# Patient Record
Sex: Male | Born: 1960 | Race: White | Hispanic: No | Marital: Married | Smoking: Never smoker
Health system: Southern US, Community
[De-identification: ages and names within clinical notes are randomized; demographics above are authoritative.]

---

## 2005-08-22 ENCOUNTER — Emergency Department (HOSPITAL_COMMUNITY): Admission: EM | Admit: 2005-08-22 | Discharge: 2005-08-22 | Payer: Self-pay | Admitting: Family Medicine

## 2008-01-24 ENCOUNTER — Ambulatory Visit: Payer: Self-pay | Admitting: Sports Medicine

## 2008-01-24 DIAGNOSIS — M79609 Pain in unspecified limb: Secondary | ICD-10-CM | POA: Insufficient documentation

## 2008-01-24 DIAGNOSIS — M2020 Hallux rigidus, unspecified foot: Secondary | ICD-10-CM | POA: Insufficient documentation

## 2008-01-24 DIAGNOSIS — R252 Cramp and spasm: Secondary | ICD-10-CM | POA: Insufficient documentation

## 2014-03-18 ENCOUNTER — Other Ambulatory Visit: Payer: Self-pay | Admitting: Family Medicine

## 2014-03-18 DIAGNOSIS — N390 Urinary tract infection, site not specified: Secondary | ICD-10-CM

## 2014-04-03 ENCOUNTER — Ambulatory Visit
Admission: RE | Admit: 2014-04-03 | Discharge: 2014-04-03 | Disposition: A | Discharge status: Home / Self Care | Payer: 59 | Source: Ambulatory Visit | Attending: Family Medicine | Admitting: Family Medicine

## 2014-04-03 DIAGNOSIS — N390 Urinary tract infection, site not specified: Secondary | ICD-10-CM

## 2016-02-06 ENCOUNTER — Encounter (HOSPITAL_COMMUNITY): Payer: Self-pay | Admitting: *Deleted

## 2016-02-06 ENCOUNTER — Ambulatory Visit (HOSPITAL_COMMUNITY)
Admission: EM | Admit: 2016-02-06 | Discharge: 2016-02-06 | Disposition: A | Discharge status: Home / Self Care | Payer: Managed Care, Other (non HMO) | Attending: Emergency Medicine | Admitting: Emergency Medicine

## 2016-02-06 DIAGNOSIS — M5442 Lumbago with sciatica, left side: Secondary | ICD-10-CM

## 2016-02-06 MED ORDER — CYCLOBENZAPRINE HCL 10 MG PO TABS: 10.0000 mg | ORAL_TABLET | Freq: Three times a day (TID) | ORAL | 0 refills | Status: AC | PRN

## 2016-02-06 MED ORDER — HYDROCODONE-ACETAMINOPHEN 5-325 MG PO TABS: 1.0000 | ORAL_TABLET | Freq: Four times a day (QID) | ORAL | 0 refills | Status: AC | PRN

## 2016-02-06 MED ORDER — PREDNISONE 50 MG PO TABS: ORAL_TABLET | ORAL | 0 refills | Status: AC

## 2016-02-06 NOTE — ED Provider Notes (Signed)
[facility]    CSN: [id number] Arrival date & time: 02/06/16  1716  First Provider Contact:  First MD Initiated Contact with Patient 02/06/16 1804        History   Chief Complaint Chief Complaint  Patient presents with  . Back Pain  . Leg Pain    HPI Christopher Flores is a 55 y.o. male.   He is a 55 year old man here for evaluation of back pain. He states this started 2 weeks ago. He denies any specific injury or trauma, but states when he stood up, he developed a sharp pain across his lower back. He has had similar pain in the past, but not so severe. Over the next week it gradually improved. Then, last week, it seemed to worsen a little bit and he developed radiating pain, primarily down the left leg. Pain tends to be worse on first standing and going from sitting to standing. Yesterday, he developed much more severe pain part way through a bike ride. He states when they stopped at the 47 mile mark, he got off the bike he developed severe pain in his back and shooting down his left leg. He has been taking Aleve with some improvement. He had some medication with codeine from [place] that he has been taking for the last 24 hours with temporary improvement of the pain. No bowel or bladder incontinence. No weakness.      No past medical history on file.  Patient Active Problem List   Diagnosis Date Noted  . CALF PAIN, BILATERAL 01/24/2008  . CRAMP OF LIMB 01/24/2008  . 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     Home Medications    Prior to Admission medications   Medication Sig Start Date End Date Taking? Authorizing Provider  naproxen sodium (ANAPROX) 220 MG tablet Take 220 mg by mouth 2 (two) times daily with a meal.   Yes Historical Provider, MD  cyclobenzaprine (FLEXERIL) 10 MG tablet Take 1 tablet (10 mg total) by mouth 3 (three) times daily as needed for muscle spasms. 02/06/16   Charm Rings, MD  HYDROcodone-acetaminophen  (NORCO) 5-325 MG tablet Take 1 tablet by mouth every 6 (six) hours as needed for moderate pain. 02/06/16   Charm Rings, MD  predniSONE (DELTASONE) 50 MG tablet Take 1 pill daily for 5 days. 02/06/16   Charm Rings, MD    Family History No family history on file.  Social History Social History  Substance Use Topics  . Smoking status: Never Smoker  . Smokeless tobacco: Never Used  . Alcohol use Yes     Comment: occ     Allergies   Review of patient's allergies indicates no known allergies.   Review of Systems Review of Systems  Musculoskeletal: Positive for back pain.       Leg pain     Physical Exam Triage Vital Signs ED Triage Vitals  Enc Vitals Group     BP 02/06/16 1806 155/100     Pulse Rate 02/06/16 1806 78     Resp 02/06/16 1806 19     Temp 02/06/16 1806 98 F (36.7 C)     Temp Source 02/06/16 1806 Oral     SpO2 02/06/16 1806 99 %     Weight --      Height --      Head Circumference --      Peak Flow --      Pain Score  02/06/16 1807 9     Pain Loc --      Pain Edu? --      Excl. in GC? --    No data found.   Updated Vital Signs BP 155/100   Pulse 78   Temp 98 F (36.7 C) (Oral)   Resp 19   SpO2 99%   Visual Acuity Right Eye Distance:   Left Eye Distance:   Bilateral Distance:    Right Eye Near:   Left Eye Near:    Bilateral Near:     Physical Exam  Constitutional: He is oriented to person, place, and time. He appears well-developed and well-nourished. No distress.  Cardiovascular: Normal rate.   Pulmonary/Chest: Effort normal.  Musculoskeletal:  Back: No erythema or edema. Icy hot patch in place across the lower back. No vertebral tenderness or step-offs. He does have significant spasm in the left lumbar paraspinous musculature. This is mildly tender to palpation. Positive straight leg raise on the left.  Neurological: He is alert and oriented to person, place, and time.     UC Treatments / Results  Labs (all labs ordered are listed,  but only abnormal results are displayed) Labs Reviewed - No data to display  EKG  EKG Interpretation None       Radiology No results found.  Procedures Procedures (including critical care time)  Medications Ordered in UC Medications - No data to display   Initial Impression / Assessment and Plan / UC Course  I have reviewed the triage vital signs and the nursing notes.  Pertinent labs & imaging results that were available during my care of the patient were reviewed by me and considered in my medical decision making (see chart for details).  Clinical Course    Extensive discussion with patient regarding possible etiologies of sciatica. At this time, I suspect this is coming from muscle spasm rather than a herniated or bulging disc. Symptomatic treatment with prednisone, Aleve as needed, and hydrocodone as needed. Recommended relative rest with gentle stretching over the next week. Follow-up with PCP in 2-3 weeks. If pain is persistent, he will likely need an MRI.  Final Clinical Impressions(s) / UC Diagnoses   Final diagnoses:  Bilateral low back pain with left-sided sciatica    New Prescriptions Discharge Medication List as of 02/06/2016  6:42 PM    START taking these medications   Details  cyclobenzaprine (FLEXERIL) 10 MG tablet Take 1 tablet (10 mg total) by mouth 3 (three) times daily as needed for muscle spasms., Starting Sun 02/06/2016, Print    HYDROcodone-acetaminophen (NORCO) 5-325 MG tablet Take 1 tablet by mouth every 6 (six) hours as needed for moderate pain., Starting Sun 02/06/2016, Print    predniSONE (DELTASONE) 50 MG tablet Take 1 pill daily for 5 days., Print         Charm Rings, MD 02/06/16 [phone]

## 2016-02-06 NOTE — ED Triage Notes (Signed)
Patient reports lower back pain since 7/14. Pain started radiating down into left and right leg 7/25, left more then right. Was seen by chiropractor on 7/28. States that yesterday he went for a bike ride and had to stop due to severe pain. Patient reports he has taken scheduled 2 meds from [place] to help with the pain and aleve. Patient reports intermittent numbness and tingling to both feet, non at this time. Denies any injury associated with back pain.

## 2016-02-06 NOTE — Discharge Instructions (Signed)
I suspect your sciatic nerve is getting irritated by the muscles rather than a herniated disc. Take prednisone daily for 5 days. You can continue the Aleve as needed. Take flexeril 3 times a day as needed for muscle spasm and tightness. Take hydrocodone every 4-6 hours as needed for severe pain. Take it easy and do gentle stretching over the next week. Follow-up with your primary care doctor in 2-3 weeks for a recheck. If this does not improve, you will likely need an MRI.

## 2016-05-22 ENCOUNTER — Other Ambulatory Visit: Payer: Self-pay | Admitting: Family Medicine

## 2016-05-22 DIAGNOSIS — M545 Low back pain: Secondary | ICD-10-CM

## 2016-05-28 ENCOUNTER — Ambulatory Visit
Admission: RE | Admit: 2016-05-28 | Discharge: 2016-05-28 | Disposition: A | Discharge status: Home / Self Care | Payer: 59 | Source: Ambulatory Visit | Attending: Family Medicine | Admitting: Family Medicine

## 2016-05-28 DIAGNOSIS — M545 Low back pain: Secondary | ICD-10-CM

## 2016-11-28 DIAGNOSIS — M5440 Lumbago with sciatica, unspecified side: Secondary | ICD-10-CM | POA: Diagnosis not present

## 2016-11-28 DIAGNOSIS — M5136 Other intervertebral disc degeneration, lumbar region: Secondary | ICD-10-CM | POA: Diagnosis not present

## 2016-11-28 DIAGNOSIS — M48062 Spinal stenosis, lumbar region with neurogenic claudication: Secondary | ICD-10-CM | POA: Diagnosis not present

## 2016-11-28 DIAGNOSIS — M5126 Other intervertebral disc displacement, lumbar region: Secondary | ICD-10-CM | POA: Diagnosis not present

## 2017-03-30 DIAGNOSIS — R8290 Unspecified abnormal findings in urine: Secondary | ICD-10-CM | POA: Diagnosis not present

## 2017-03-30 DIAGNOSIS — R300 Dysuria: Secondary | ICD-10-CM | POA: Diagnosis not present

## 2017-03-30 DIAGNOSIS — Z23 Encounter for immunization: Secondary | ICD-10-CM | POA: Diagnosis not present

## 2017-03-30 DIAGNOSIS — R361 Hematospermia: Secondary | ICD-10-CM | POA: Diagnosis not present

## 2017-04-24 DIAGNOSIS — R361 Hematospermia: Secondary | ICD-10-CM | POA: Diagnosis not present

## 2017-04-24 DIAGNOSIS — N400 Enlarged prostate without lower urinary tract symptoms: Secondary | ICD-10-CM | POA: Diagnosis not present

## 2017-05-30 DIAGNOSIS — J029 Acute pharyngitis, unspecified: Secondary | ICD-10-CM | POA: Diagnosis not present

## 2017-05-30 DIAGNOSIS — J069 Acute upper respiratory infection, unspecified: Secondary | ICD-10-CM | POA: Diagnosis not present

## 2017-06-04 DIAGNOSIS — M48062 Spinal stenosis, lumbar region with neurogenic claudication: Secondary | ICD-10-CM | POA: Diagnosis not present

## 2017-06-04 DIAGNOSIS — M5440 Lumbago with sciatica, unspecified side: Secondary | ICD-10-CM | POA: Diagnosis not present

## 2017-06-04 DIAGNOSIS — M5136 Other intervertebral disc degeneration, lumbar region: Secondary | ICD-10-CM | POA: Diagnosis not present

## 2017-06-04 DIAGNOSIS — M5126 Other intervertebral disc displacement, lumbar region: Secondary | ICD-10-CM | POA: Diagnosis not present

## 2017-07-17 DIAGNOSIS — R361 Hematospermia: Secondary | ICD-10-CM | POA: Diagnosis not present

## 2017-07-17 DIAGNOSIS — N400 Enlarged prostate without lower urinary tract symptoms: Secondary | ICD-10-CM | POA: Diagnosis not present

## 2018-01-11 DIAGNOSIS — M5432 Sciatica, left side: Secondary | ICD-10-CM | POA: Diagnosis not present

## 2018-01-11 DIAGNOSIS — M545 Low back pain: Secondary | ICD-10-CM | POA: Diagnosis not present

## 2018-01-31 DIAGNOSIS — M5126 Other intervertebral disc displacement, lumbar region: Secondary | ICD-10-CM | POA: Diagnosis not present

## 2018-01-31 DIAGNOSIS — M5440 Lumbago with sciatica, unspecified side: Secondary | ICD-10-CM | POA: Diagnosis not present

## 2018-01-31 DIAGNOSIS — M5416 Radiculopathy, lumbar region: Secondary | ICD-10-CM | POA: Diagnosis not present

## 2018-01-31 DIAGNOSIS — M4726 Other spondylosis with radiculopathy, lumbar region: Secondary | ICD-10-CM | POA: Diagnosis not present

## 2018-02-25 IMAGING — MR MR LUMBAR SPINE W/O CM
5 series · 47 of 48 positions shown · non-contrast
Comparison: None.

CLINICAL DATA: Low back and left buttock and leg pain to the mid
calf since January 2016. No known injury.

EXAM:
MRI LUMBAR SPINE WITHOUT CONTRAST
TECHNIQUE: Multiplanar, multisequence MR imaging of the lumbar spine was
performed. No intravenous contrast was administered.

[Series 3: T2 · sagittal · 4.0mm · 0.88mm/px · 6 of 13 slices shown (1 of 2)]
[im 1/13]
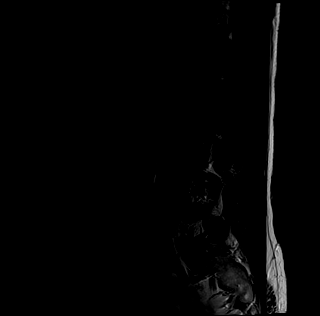
[im 3/13]
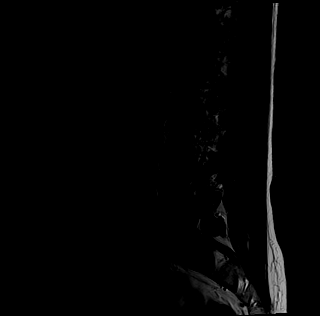
[im 5/13]
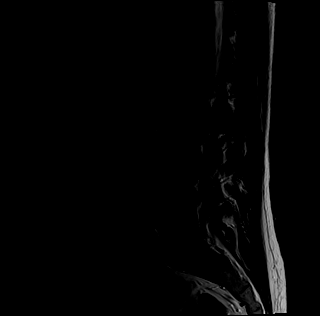
[im 8/13]
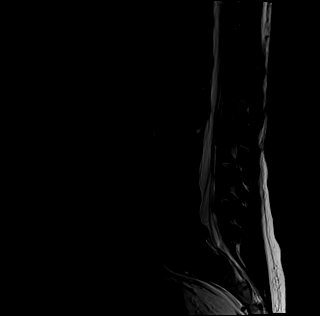
[im 10/13]
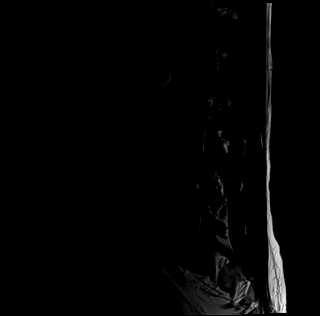
[im 13/13]
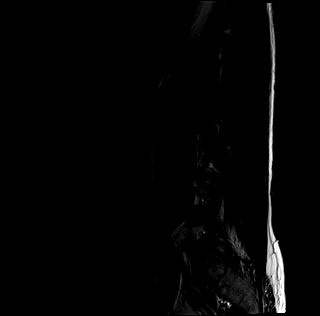

[Series 4: T1 · sagittal · 4.0mm · 0.88mm/px · 5 of 13 slices shown (1 of 2)]
[im 1/13]
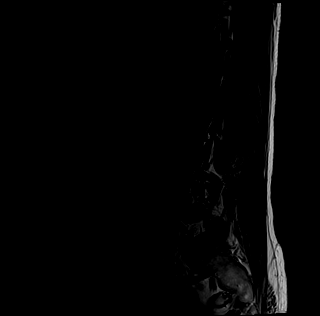
[im 4/13]
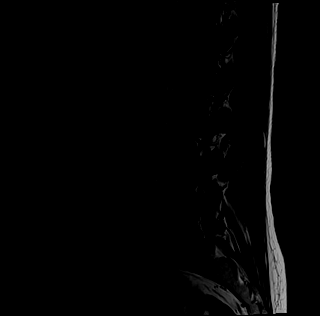
[im 7/13]
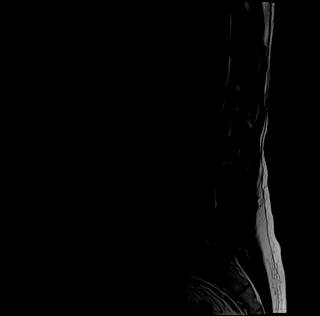
[im 10/13]
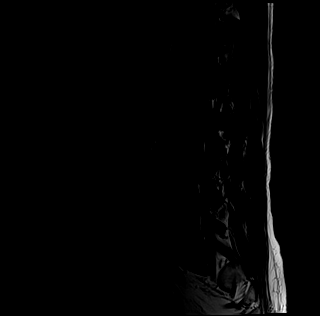
[im 13/13]
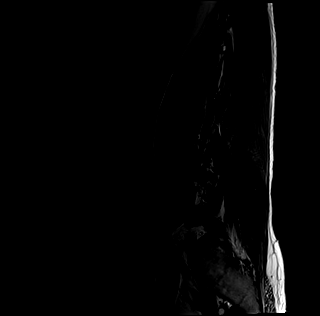

[Series 5: T2 · axial · 4.0mm · 0.78mm/px · z∈[-110,+94]mm · 16 of 40 slices shown (2 of 2)]
[im 1/40]
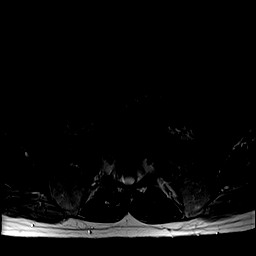
[im 3/40]
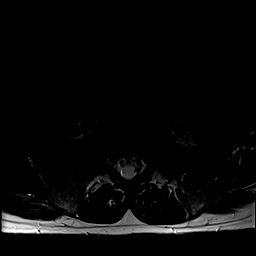
[im 6/40]
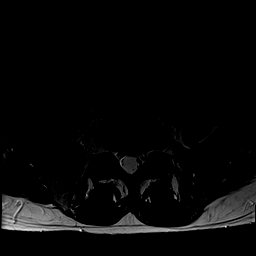
[im 8/40]
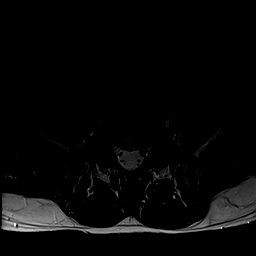
[im 11/40]
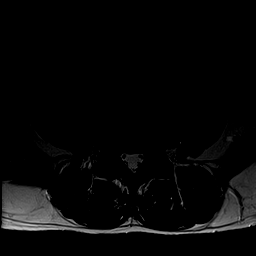
[im 14/40]
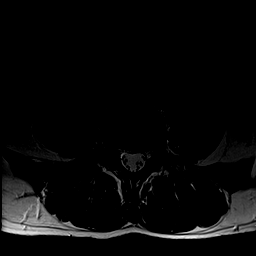
[im 16/40]
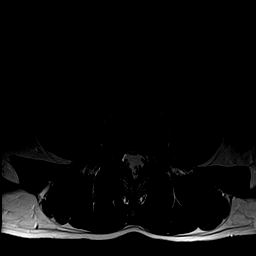
[im 19/40]
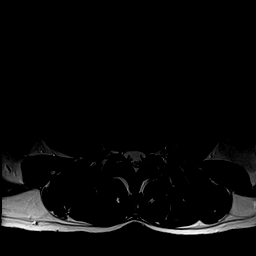
[im 21/40]
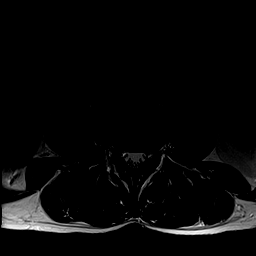
[im 24/40]
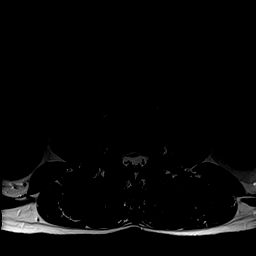
[im 27/40]
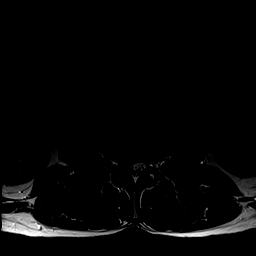
[im 29/40]
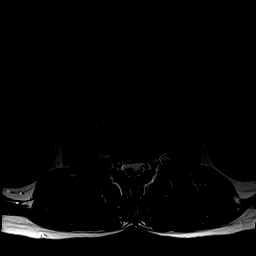
[im 32/40]
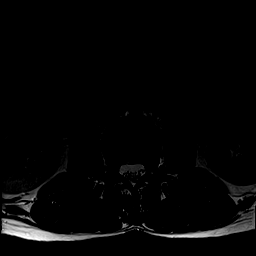
[im 34/40]
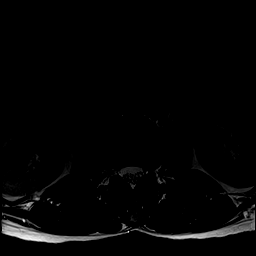
[im 37/40]
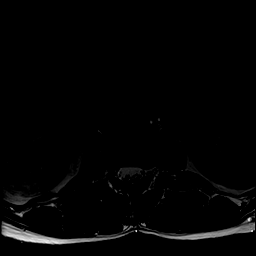
[im 40/40]
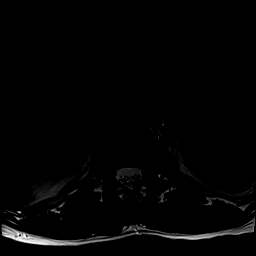

[Series 6: tirm sag · sagittal · 4.0mm · 0.55mm/px · 5 of 13 slices shown]
[im 1/13]
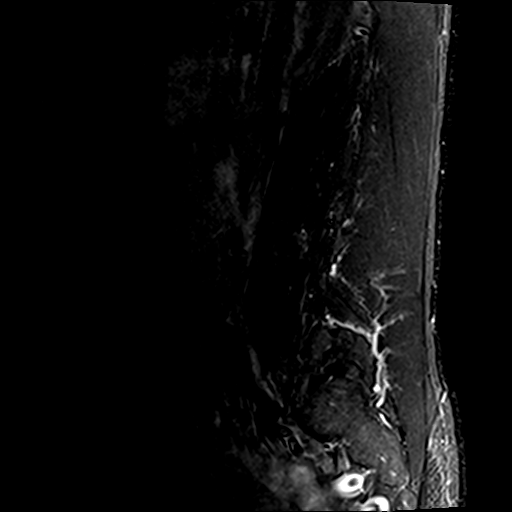
[im 4/13]
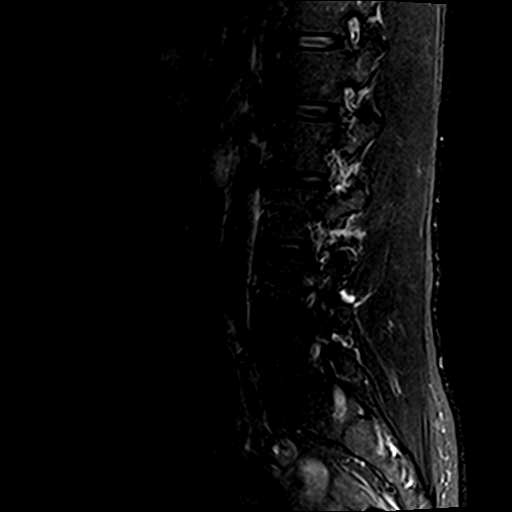
[im 7/13]
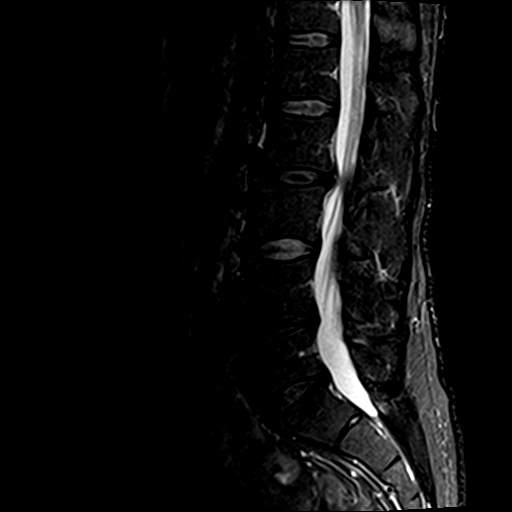
[im 10/13]
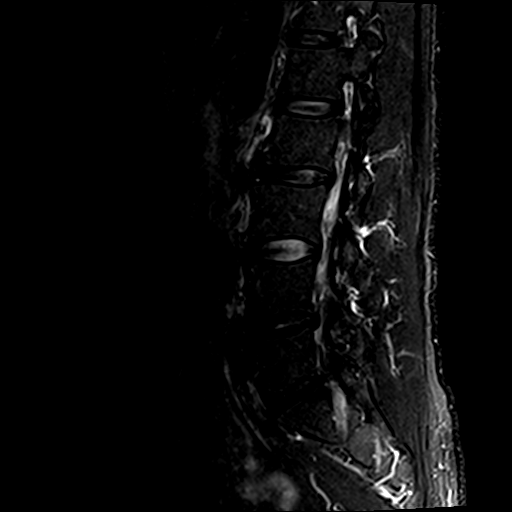
[im 13/13]
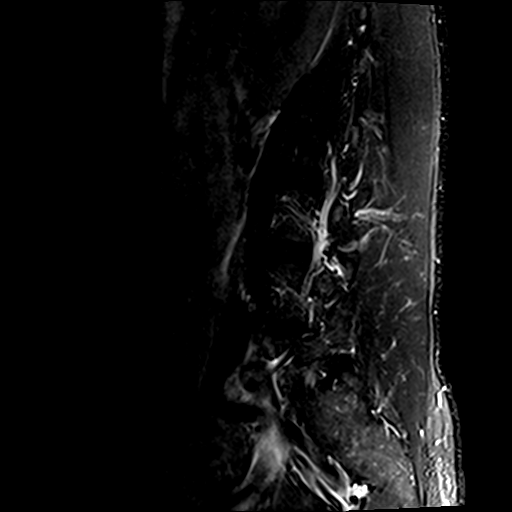

[Series 7: T1 · axial · 4.0mm · 0.78mm/px · z∈[-110,+94]mm · 15 of 40 slices shown (2 of 2)]
[im 1/40]
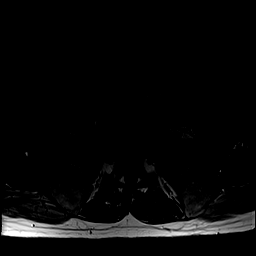
[im 3/40]
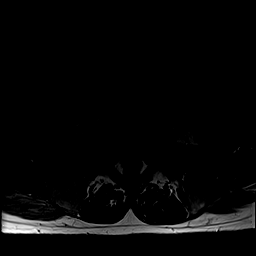
[im 6/40]
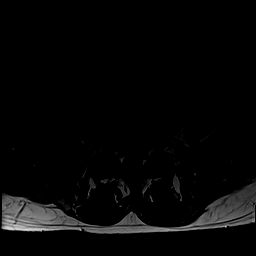
[im 8/40]
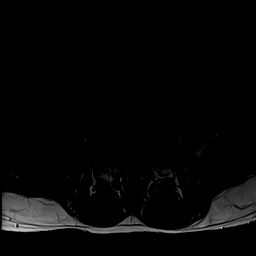
[im 11/40]
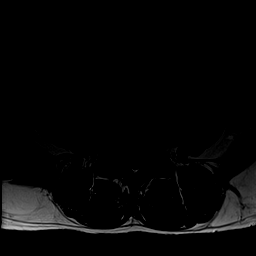
[im 14/40]
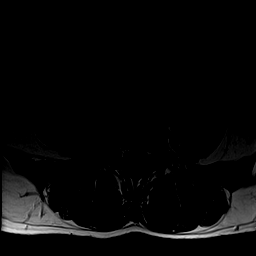
[im 16/40]
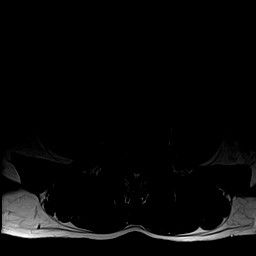
[im 19/40]
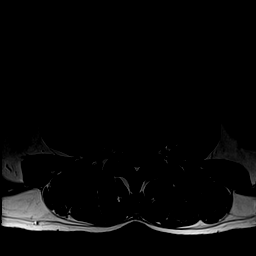
[im 21/40]
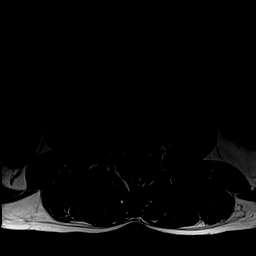
[im 24/40]
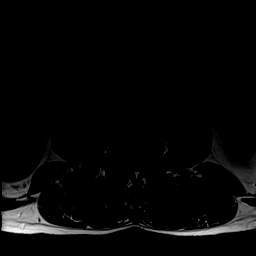
[im 27/40]
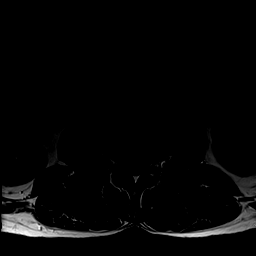
[im 29/40]
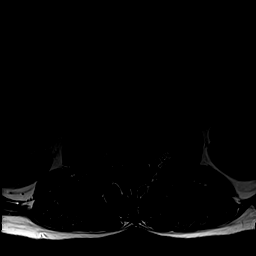
[im 32/40]
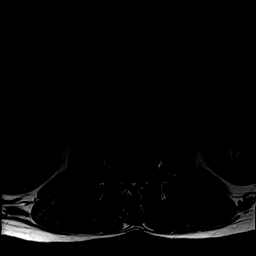
[im 34/40]
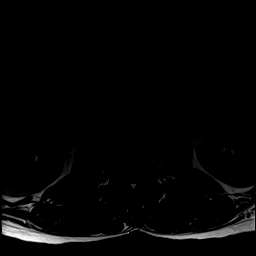
[im 40/40]
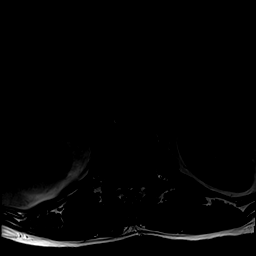

[47 of 48 positions shown; findings below may reference images not displayed]

FINDINGS: Segmentation:  Standard.

Alignment:  Maintained.

Vertebrae:  No fracture or worrisome lesion.

Conus medullaris: Extends to the T12 level and appears normal.

Paraspinal and other soft tissues: Negative.

Disc levels:

T12-L1 is imaged in the sagittal plane only and negative.

L1-2:  Negative.

L2-3: There is a right paracentral and subarticular recess disc
protrusion causing narrowing in the subarticular recess which could
impact the descending right L3 root. The foramina are open.

L3-4:  Negative.

L4-5: Annular fissure and shallow right paracentral protrusion
without central canal or foraminal stenosis.

L5-S1: Shallow left paracentral protrusion contacts the medial
periphery of the left S1 root without compression or displacement.
The thecal sac and foramina are widely patent.
IMPRESSION: Shallow left paracentral protrusion at L5-S1 contacts the left S1
root without compressing or displacing it. The thecal sac and
foramina are widely patent at this level.

Shallow broad-based right paracentral and subarticular recess
protrusion at L2-3 could irritate the descending right L3 root.

## 2018-03-27 DIAGNOSIS — Z0000 Encounter for general adult medical examination without abnormal findings: Secondary | ICD-10-CM | POA: Diagnosis not present

## 2018-03-27 DIAGNOSIS — Z125 Encounter for screening for malignant neoplasm of prostate: Secondary | ICD-10-CM | POA: Diagnosis not present

## 2018-03-27 DIAGNOSIS — Z136 Encounter for screening for cardiovascular disorders: Secondary | ICD-10-CM | POA: Diagnosis not present

## 2018-05-03 DIAGNOSIS — R7989 Other specified abnormal findings of blood chemistry: Secondary | ICD-10-CM | POA: Diagnosis not present

## 2018-05-31 DIAGNOSIS — M48062 Spinal stenosis, lumbar region with neurogenic claudication: Secondary | ICD-10-CM | POA: Diagnosis not present

## 2018-05-31 DIAGNOSIS — M5126 Other intervertebral disc displacement, lumbar region: Secondary | ICD-10-CM | POA: Diagnosis not present

## 2018-05-31 DIAGNOSIS — M4726 Other spondylosis with radiculopathy, lumbar region: Secondary | ICD-10-CM | POA: Diagnosis not present

## 2018-05-31 DIAGNOSIS — M5136 Other intervertebral disc degeneration, lumbar region: Secondary | ICD-10-CM | POA: Diagnosis not present

## 2018-05-31 DIAGNOSIS — M5416 Radiculopathy, lumbar region: Secondary | ICD-10-CM | POA: Diagnosis not present

## 2018-06-12 DIAGNOSIS — M5136 Other intervertebral disc degeneration, lumbar region: Secondary | ICD-10-CM | POA: Diagnosis not present

## 2018-06-12 DIAGNOSIS — M48061 Spinal stenosis, lumbar region without neurogenic claudication: Secondary | ICD-10-CM | POA: Diagnosis not present

## 2018-06-12 DIAGNOSIS — M4726 Other spondylosis with radiculopathy, lumbar region: Secondary | ICD-10-CM | POA: Diagnosis not present

## 2018-07-16 DIAGNOSIS — M545 Low back pain: Secondary | ICD-10-CM | POA: Diagnosis not present

## 2018-07-16 DIAGNOSIS — M5440 Lumbago with sciatica, unspecified side: Secondary | ICD-10-CM | POA: Diagnosis not present

## 2018-07-22 DIAGNOSIS — M4726 Other spondylosis with radiculopathy, lumbar region: Secondary | ICD-10-CM | POA: Diagnosis not present

## 2018-07-22 DIAGNOSIS — M5136 Other intervertebral disc degeneration, lumbar region: Secondary | ICD-10-CM | POA: Diagnosis not present

## 2018-07-22 DIAGNOSIS — M5126 Other intervertebral disc displacement, lumbar region: Secondary | ICD-10-CM | POA: Diagnosis not present

## 2018-07-22 DIAGNOSIS — M5416 Radiculopathy, lumbar region: Secondary | ICD-10-CM | POA: Diagnosis not present

## 2018-08-07 DIAGNOSIS — M5117 Intervertebral disc disorders with radiculopathy, lumbosacral region: Secondary | ICD-10-CM | POA: Diagnosis not present

## 2018-08-07 DIAGNOSIS — M5136 Other intervertebral disc degeneration, lumbar region: Secondary | ICD-10-CM | POA: Diagnosis not present

## 2018-08-07 DIAGNOSIS — M5127 Other intervertebral disc displacement, lumbosacral region: Secondary | ICD-10-CM | POA: Diagnosis not present

## 2018-08-07 DIAGNOSIS — M5137 Other intervertebral disc degeneration, lumbosacral region: Secondary | ICD-10-CM | POA: Diagnosis not present

## 2018-08-07 DIAGNOSIS — M4726 Other spondylosis with radiculopathy, lumbar region: Secondary | ICD-10-CM | POA: Diagnosis not present

## 2018-09-09 DIAGNOSIS — N3943 Post-void dribbling: Secondary | ICD-10-CM | POA: Diagnosis not present

## 2018-09-09 DIAGNOSIS — R361 Hematospermia: Secondary | ICD-10-CM | POA: Diagnosis not present

## 2018-09-09 DIAGNOSIS — N401 Enlarged prostate with lower urinary tract symptoms: Secondary | ICD-10-CM | POA: Diagnosis not present

## 2018-09-09 DIAGNOSIS — R3912 Poor urinary stream: Secondary | ICD-10-CM | POA: Diagnosis not present

## 2019-02-25 DIAGNOSIS — M5416 Radiculopathy, lumbar region: Secondary | ICD-10-CM | POA: Diagnosis not present

## 2019-02-25 DIAGNOSIS — M5136 Other intervertebral disc degeneration, lumbar region: Secondary | ICD-10-CM | POA: Diagnosis not present

## 2019-02-25 DIAGNOSIS — Z9889 Other specified postprocedural states: Secondary | ICD-10-CM | POA: Diagnosis not present

## 2019-02-25 DIAGNOSIS — M4726 Other spondylosis with radiculopathy, lumbar region: Secondary | ICD-10-CM | POA: Diagnosis not present

## 2019-04-03 DIAGNOSIS — Z0000 Encounter for general adult medical examination without abnormal findings: Secondary | ICD-10-CM | POA: Diagnosis not present

## 2019-04-03 DIAGNOSIS — Z13220 Encounter for screening for lipoid disorders: Secondary | ICD-10-CM | POA: Diagnosis not present

## 2019-04-03 DIAGNOSIS — Z125 Encounter for screening for malignant neoplasm of prostate: Secondary | ICD-10-CM | POA: Diagnosis not present

## 2020-10-12 LAB — COLOGUARD: COLOGUARD: NEGATIVE

## 2023-12-08 LAB — COLOGUARD: COLOGUARD: NEGATIVE

## 2024-05-30 ENCOUNTER — Emergency Department (HOSPITAL_BASED_OUTPATIENT_CLINIC_OR_DEPARTMENT_OTHER): Admitting: Radiology

## 2024-05-30 ENCOUNTER — Encounter (HOSPITAL_BASED_OUTPATIENT_CLINIC_OR_DEPARTMENT_OTHER): Payer: Self-pay

## 2024-05-30 ENCOUNTER — Other Ambulatory Visit: Payer: Self-pay

## 2024-05-30 ENCOUNTER — Emergency Department (HOSPITAL_BASED_OUTPATIENT_CLINIC_OR_DEPARTMENT_OTHER)
Admission: EM | Admit: 2024-05-30 | Discharge: 2024-05-30 | Disposition: A | Discharge status: Home / Self Care | Attending: Emergency Medicine | Admitting: Emergency Medicine

## 2024-05-30 DIAGNOSIS — R0602 Shortness of breath: Secondary | ICD-10-CM | POA: Insufficient documentation

## 2024-05-30 DIAGNOSIS — R42 Dizziness and giddiness: Secondary | ICD-10-CM | POA: Insufficient documentation

## 2024-05-30 LAB — BASIC METABOLIC PANEL WITH GFR
Anion gap: 9 (ref 5–15)
BUN: 16 mg/dL (ref 8–23)
CO2: 29 mmol/L (ref 22–32)
Calcium: 10.1 mg/dL (ref 8.9–10.3)
Chloride: 102 mmol/L (ref 98–111)
Creatinine, Ser: 0.83 mg/dL (ref 0.61–1.24)
GFR, Estimated: >60 mL/min (ref >60)
Glucose, Bld: 114 mg/dL — ABNORMAL HIGH (ref 70–99)
Potassium: 4.4 mmol/L (ref 3.5–5.1)
Sodium: 140 mmol/L (ref 135–145)

## 2024-05-30 LAB — HEPATIC FUNCTION PANEL
ALT: 29 U/L (ref 0–44)
AST: 36 U/L (ref 15–41)
Albumin: 4.5 g/dL (ref 3.5–5.0)
Alkaline Phosphatase: 42 U/L (ref 38–126)
Bilirubin, Direct: 0.3 mg/dL — ABNORMAL HIGH (ref 0.0–0.2)
Indirect Bilirubin: 0.5 mg/dL (ref 0.3–0.9)
Total Bilirubin: 0.8 mg/dL (ref 0.0–1.2)
Total Protein: 7.2 g/dL (ref 6.5–8.1)

## 2024-05-30 LAB — TROPONIN T, HIGH SENSITIVITY: Troponin T High Sensitivity: <15 ng/L (ref 0–19)

## 2024-05-30 LAB — CBC
HCT: 40.1 % (ref 39.0–52.0)
Hemoglobin: 14 g/dL (ref 13.0–17.0)
MCH: 32.1 pg (ref 26.0–34.0)
MCHC: 34.9 g/dL (ref 30.0–36.0)
MCV: 92 fL (ref 80.0–100.0)
Platelets: 217 K/uL (ref 150–400)
RBC: 4.36 MIL/uL (ref 4.22–5.81)
RDW: 13 % (ref 11.5–15.5)
WBC: 5.4 K/uL (ref 4.0–10.5)
nRBC: 0 % (ref 0.0–0.2)

## 2024-05-30 LAB — D-DIMER, QUANTITATIVE: D-Dimer, Quant: 0.37 ug{FEU}/mL (ref 0.00–0.50)

## 2024-05-30 LAB — PRO BRAIN NATRIURETIC PEPTIDE: Pro Brain Natriuretic Peptide: 81 pg/mL (ref <300.0)

## 2024-05-30 LAB — CBG MONITORING, ED: Glucose-Capillary: 102 mg/dL — ABNORMAL HIGH (ref 70–99)

## 2024-05-30 NOTE — ED Notes (Signed)
 Reviewed AVS/discharge instruction with patient. Time allotted for and all questions answered. Patient is agreeable for d/c and escorted to ed exit by staff.

## 2024-05-30 NOTE — ED Triage Notes (Signed)
 Arrives POV with complaints of increased dizziness and shortness of breath (at rest) x3 days.

## 2024-05-30 NOTE — ED Provider Notes (Signed)
  EMERGENCY DEPARTMENT AT [facility] Provider Note   CSN: [id number] Arrival date & time: 05/30/24  1138     Patient presents with: Dizziness and Shortness of Breath   Christopher Flores is a 63 y.o. male.  Patient without significant medical history presents to the emergency department with concerns of shortness of breath and dizziness.  Reportedly has had intermittent episodes of shortness of breath primarily in the evenings over the last 3 days.  He states that this has developed after he received a joint injection for SI pain.  He states that this happened to him last time but he was concerned due to the shortness of breath more pronounced and persistent.  No reported cough, congestion, sore throat, hemoptysis.  No history of PE.  He denies any significant chest pain at this time.  He does report that he is a fairly active cyclist and will typically cycle between 3 to 4 days/week.  He denies any significant difficulty with breathing during cycling.   Dizziness Associated symptoms: shortness of breath   Shortness of Breath      Prior to Admission medications   Medication Sig Start Date End Date Taking? Authorizing Provider  diclofenac (VOLTAREN) 75 MG EC tablet Take 75 mg by mouth 2 (two) times daily. 10/11/20  Yes [provider]  tamsulosin (FLOMAX) 0.4 MG CAPS capsule Take 0.4 mg by mouth daily. 08/24/22  Yes [provider]  cyclobenzaprine  (FLEXERIL ) 10 MG tablet Take 1 tablet (10 mg total) by mouth 3 (three) times daily as needed for muscle spasms. 02/06/16   Diedra Rocky PARAS, MD  HYDROcodone -acetaminophen  (NORCO) 5-325 MG tablet Take 1 tablet by mouth every 6 (six) hours as needed for moderate pain. 02/06/16   Diedra Rocky PARAS, MD  naproxen sodium (ANAPROX) 220 MG tablet Take 220 mg by mouth 2 (two) times daily with a meal.    [provider]  predniSONE  (DELTASONE ) 50 MG tablet Take 1 pill daily for 5 days. 02/06/16   Diedra Rocky PARAS, MD   tadalafil (CIALIS) 5 MG tablet Take 5 mg by mouth daily.    [provider]    Allergies: Patient has no known allergies.    Review of Systems  Respiratory:  Positive for shortness of breath.   Neurological:  Positive for dizziness.  All other systems reviewed and are negative.   Updated Vital Signs BP 124/81   Pulse (!) 43   Temp 98.2 F (36.8 C) (Oral)   Resp 19   Ht 5' 10 (1.778 m)   Wt 68 kg   SpO2 100%   BMI 21.52 kg/m   Physical Exam Vitals and nursing note reviewed.  Constitutional:      General: He is not in acute distress.    Appearance: He is well-developed.  HENT:     Head: Normocephalic and atraumatic.  Eyes:     Conjunctiva/sclera: Conjunctivae normal.  Cardiovascular:     Rate and Rhythm: Normal rate and regular rhythm.     Heart sounds: No murmur heard. Pulmonary:     Effort: Pulmonary effort is normal. No respiratory distress.     Breath sounds: Normal breath sounds. No decreased breath sounds, wheezing, rhonchi or rales.  Chest:     Chest wall: No mass, deformity or tenderness.  Abdominal:     Palpations: Abdomen is soft. There is no mass.     Tenderness: There is no abdominal tenderness. There is no guarding.  Musculoskeletal:  General: No swelling.     Cervical back: Neck supple.     Right lower leg: No tenderness. No edema.     Left lower leg: No tenderness. No edema.  Skin:    General: Skin is warm and dry.     Capillary Refill: Capillary refill takes less than 2 seconds.  Neurological:     Mental Status: He is alert.  Psychiatric:        Mood and Affect: Mood normal.     (all labs ordered are listed, but only abnormal results are displayed) Labs Reviewed  BASIC METABOLIC PANEL WITH GFR - Abnormal; Notable for the following components:      Result Value   Glucose, Bld 114 (*)    All other components within normal limits  HEPATIC FUNCTION PANEL - Abnormal; Notable for the following components:   Bilirubin, Direct  0.3 (*)    All other components within normal limits  CBG MONITORING, ED - Abnormal; Notable for the following components:   Glucose-Capillary 102 (*)    All other components within normal limits  CBC  PRO BRAIN NATRIURETIC PEPTIDE  D-DIMER, QUANTITATIVE  TROPONIN T, HIGH SENSITIVITY  TROPONIN T, HIGH SENSITIVITY    EKG: None  Radiology: DG Chest 2 View Result Date: 05/30/2024 EXAM: 2 VIEW(S) XRAY OF THE CHEST 05/30/2024 12:06:14 PM COMPARISON: None available. CLINICAL HISTORY: shortness of breath FINDINGS: LUNGS AND PLEURA: No focal pulmonary opacity. No pleural effusion. No pneumothorax. HEART AND MEDIASTINUM: No acute abnormality of the cardiac and mediastinal silhouettes. BONES AND SOFT TISSUES: No acute osseous abnormality. IMPRESSION: 1. No acute cardiopulmonary process. Electronically signed by: Waddell Calk MD 05/30/2024 12:37 PM EST RP Workstation: [id number]     Procedures   Medications Ordered in the ED - No data to display                                  Medical Decision Making Amount and/or Complexity of Data Reviewed Labs: ordered. Radiology: ordered.   This patient presents to the ED for concern of shortness of breath, lightheadedness, this involves an extensive number of treatment options, and is a complaint that carries with it a high risk of complications and morbidity.  The differential diagnosis includes ACS, PE, pneumonia, bronchitis, CHF, dehydration   Co morbidities that complicate the patient evaluation  None   Lab Tests:  I Ordered, and personally interpreted labs.  The pertinent results include: CBC, BMP, hepatic function panel unremarkable, D-dimer negative, BNP negative at 81, troponin negative   Imaging Studies ordered:  I ordered imaging studies including chest x-ray I independently visualized and interpreted imaging which showed no acute cardiopulmonary process I agree with the radiologist interpretation   Cardiac Monitoring: /  EKG:  The patient was maintained on a cardiac monitor.  I personally viewed and interpreted the cardiac monitored which showed an underlying rhythm of: Sinus bradycardia   Consultations Obtained:  I requested consultation with none,  and discussed lab and imaging findings as well as pertinent plan - they recommend: N/A   Problem List / ED Course / Critical interventions / Medication management  Patient presenting Emergency Department with concerns of shortness of breath and lightheadedness.  Reports that his began about 3 days ago.  States that he has developed these similar symptoms twice in the last several months after receiving injections in his low back.  Reports that these are steroid injections done by his  orthopedic physician.  He states that he has continued to feel slightly short of breath primarily in the evenings but denies that this is with laying down.  Reports that he notices the shortness of breath while he is sitting at a desk and working. On exam, there is no abnormal heart or lung sounds.  There is no pitting edema.  He is otherwise well-appearing with normal vitals with exception of his bradycardia that is not unexpected given he reports that he is very physically active with cycling multiple times per week. Patient's workup included evaluation of ACS with troponin, BNP for heart failure assessment, and D-dimer for evaluation of possible PE.  His labs were thankfully reassuring with a normal CBC, BMP, hepatic function panel, and CBC.  He had a negative troponin x 1 which is appropriate given his prolonged symptoms, negative D-dimer, and negative BNP.  Doubtful of ACS, PE, or CHF.  His chest x-ray is also clear. Given negative workup, I am unsure exactly what caused his symptoms but do suspect possibly medication related as he states that he has had similar symptoms in the past when receiving his steroid joint injection.  Advised patient to follow-up closely with his PCP and  stricter precautions were also advised.  He is otherwise stable for outpatient follow-up and discharged home. I have reviewed the patients home medicines and have made adjustments as needed   Social Determinants of Health:  None   Test / Admission - Considered:  Considered but stable for outpatient follow-up.  Final diagnoses:  Shortness of breath  Lightheadedness    ED Discharge Orders     None          Cecily Legrand LABOR, PA-C 05/30/24 1717    Emil Share, DO 06/01/24 2302

## 2024-05-30 NOTE — Discharge Instructions (Addendum)
 Your seen in the emergency department today for concerns of shortness of breath and lightheadedness.  Your labs, imaging, and EKG were thankfully reassuring without any obvious abnormalities to explain your current symptoms.  There were no findings to suggest acute coronary syndrome, congestive heart failure, or a blood clot.  I would recommend following up closely with your primary care provider for further evaluation.  I would not recommend any significant changes to your physical activity.  If you notice any trouble breathing with activity such as walking, running, or cycling, try to reduce your activity or stop entirely. Return to the ER for any concerns of new or worsening symptoms.
